# Patient Record
Sex: Male | Born: 2000
Health system: Southern US, Community
[De-identification: ages and names within clinical notes are randomized; demographics above are authoritative.]

## PROBLEM LIST (undated history)

## (undated) DIAGNOSIS — J020 Streptococcal pharyngitis: Secondary | ICD-10-CM

## (undated) DIAGNOSIS — T7840XA Allergy, unspecified, initial encounter: Secondary | ICD-10-CM

## (undated) DIAGNOSIS — J302 Other seasonal allergic rhinitis: Secondary | ICD-10-CM

## (undated) DIAGNOSIS — J181 Lobar pneumonia, unspecified organism: Principal | ICD-10-CM

## (undated) DIAGNOSIS — S83002A Unspecified subluxation of left patella, initial encounter: Principal | ICD-10-CM

## (undated) DIAGNOSIS — J351 Hypertrophy of tonsils: Secondary | ICD-10-CM

## (undated) DIAGNOSIS — H6690 Otitis media, unspecified, unspecified ear: Secondary | ICD-10-CM

## (undated) HISTORY — DX: Unspecified subluxation of left patella, initial encounter: S83.002A

## (undated) HISTORY — DX: Streptococcal pharyngitis: J02.0

## (undated) HISTORY — DX: Other seasonal allergic rhinitis: J30.2

## (undated) HISTORY — DX: Otitis media, unspecified, unspecified ear: H66.90

## (undated) HISTORY — DX: Allergy, unspecified, initial encounter: T78.40XA

## (undated) HISTORY — PX: MOUTH SURGERY: SHX715

## (undated) HISTORY — PX: CIRCUMCISION: SUR203

## (undated) HISTORY — DX: Lobar pneumonia, unspecified organism: J18.1

## (undated) HISTORY — DX: Hypertrophy of tonsils: J35.1

---
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
## 2022-01-11 ENCOUNTER — Encounter: Payer: Self-pay | Admitting: Pediatrics

## 2022-01-11 ENCOUNTER — Ambulatory Visit (INDEPENDENT_AMBULATORY_CARE_PROVIDER_SITE_OTHER): Payer: Self-pay | Admitting: Pediatrics

## 2022-01-11 VITALS — Wt 199.1 lb

## 2022-01-11 DIAGNOSIS — Z23 Encounter for immunization: Secondary | ICD-10-CM

## 2022-01-11 DIAGNOSIS — G44209 Tension-type headache, unspecified, not intractable: Secondary | ICD-10-CM | POA: Insufficient documentation

## 2022-01-11 DIAGNOSIS — B354 Tinea corporis: Secondary | ICD-10-CM

## 2022-01-11 MED ORDER — ONDANSETRON HCL 4 MG PO TABS: 4.0000 mg | ORAL_TABLET | Freq: Three times a day (TID) | ORAL | 0 refills | Status: AC | PRN

## 2022-01-11 MED ORDER — CLOTRIMAZOLE-BETAMETHASONE 1-0.05 % EX CREA: 1.0000 | TOPICAL_CREAM | Freq: Two times a day (BID) | CUTANEOUS | 0 refills | Status: AC

## 2022-01-11 NOTE — Patient Instructions (Addendum)
Migraine cocktail: Advil, Benadryl and Zofran  Zofran sent to the pharmacy Clotrimazole sent to the pharmacy for the ring worm  Body Ringworm Body ringworm is an infection of the skin that often causes a ring-shaped rash. Body ringworm is also called tinea corporis. Body ringworm can affect any part of your skin. This condition is easily spread from person to person (is very contagious). What are the causes? This condition is caused by fungi called dermatophytes. The condition develops when these fungi grow out of control on the skin. You can get this condition if you touch a person or animal that has it. You can also get it if you share any items with an infected person or pet. These include: Clothing, bedding, and towels. Brushes or combs. Gym equipment. Any other object that has the fungus on it. What increases the risk? You are more likely to develop this condition if you: Play sports that involve close physical contact, such as wrestling. Sweat a lot. Live in areas that are hot and humid. Use public showers. Have a weakened disease-fighting system (immune system). What are the signs or symptoms? Symptoms of this condition include: Itchy, raised red spots and bumps. Red scaly patches. A ring-shaped rash. The rash may have: A clear center. Scales or red bumps at its center. Redness near its borders. Dry and scaly skin on or around it. How is this diagnosed? This condition can usually be diagnosed with a skin exam. A skin scraping may be taken from the affected area and examined under a microscope to see if the fungus is present. How is this treated? This condition may be treated with: An antifungal cream or ointment. An antifungal shampoo. Antifungal medicines. These may be prescribed if your ringworm: Is severe. Keeps coming back or lasts a long time. Follow these instructions at home: Take over-the-counter and prescription medicines only as told by your health care  provider. If you were given an antifungal cream or ointment: Use it as told by your health care provider. Wash the infected area and dry it completely before applying the cream or ointment. If you were given an antifungal shampoo: Use it as told by your health care provider. Leave the shampoo on your body for 3-5 minutes before rinsing. While you have a rash: Wear loose clothing to stop clothes from rubbing and irritating it. Wash or change your bed sheets every night. Wash clothes and bed sheets in hot water. Disinfect or throw out items that may be infected. Wash your hands often with soap and water for at least 20 seconds. If soap and water are not available, use hand sanitizer. If your pet has the same infection, take your pet to see a veterinarian for treatment. How is this prevented? Take a bath or shower every day and after every time you work out or play sports. Dry your skin completely after bathing. Wear sandals or shoes in public places and showers. Wash athletic clothes after each use. Do not share personal items with others. Avoid touching red patches of skin on other people. Avoid touching pets that have bald spots. If you touch an animal that has a bald spot, wash your hands. Contact a health care provider if: Your rash continues to spread after 7 days of treatment. Your rash is not gone in 4 weeks. The area around your rash gets red, warm, tender, and swollen. This information is not intended to replace advice given to you by your health care provider. Make sure you discuss  any questions you have with your health care provider. Document Revised: 08/09/2021 Document Reviewed: 08/09/2021 Elsevier Patient Education  [place].

## 2022-01-11 NOTE — Progress Notes (Signed)
Scott Kemp is an 21 y.o. male who presents with his mother.  Presents with dry scaly rash to right upper thigh, left abdomen and right wrist for the past week. No fever, no discharge, no swelling and no limitation of motion. Reports that his girlfriend recently had ring worm and his next door neighbors keep outdoor pets. Additional complaint of headache on and off for the last two weeks. Significant family history of migraines in mother and brother. Describes headache as aching, pulsing and tension-like around the temples and back behind the ears. Has not taken any medication for headaches. Denies fevers, increased work of breathing, wheezing, vomiting, diarrhea, abdominal pain, sore throat. No cough or congestion. No known drug allergies.  Patient is overdue for well child check.  The following portions of the patient's history were reviewed and updated as appropriate: allergies, current medications, past family history, past medical history, past social history, past surgical history, and problem list. Review of Systems   Constitutional: Negative. Negative for fever, activity change and appetite change.  HENT: Negative. Negative for ear pain, congestion and rhinorrhea.  Eyes: Negative.  Respiratory: Negative. Negative for cough and wheezing.  Cardiovascular: Negative.  Gastrointestinal: Negative.  Musculoskeletal: Negative. Negative for myalgias, joint swelling and gait problem.     Objective:    Physical Exam  Constitutional: He appears well-developed and well-nourished. He is active. No distress.  HENT:  Right Ear: Tympanic membrane normal.  Left Ear: Tympanic membrane normal.  Nose: No nasal discharge.  Mouth/Throat: Mucous membranes are moist. No tonsillar exudate. Oropharynx is clear. Pharynx is normal.  Eyes: Pupils are equal, round, and reactive to light.  Neck: Normal range of motion. No adenopathy.  Cardiovascular: Regular rhythm.  No murmur heard.  Pulmonary/Chest:  Effort normal. No respiratory distress. She exhibits no retraction.  Abdominal: Soft. Bowel sounds are normal. He exhibits no distension.  Musculoskeletal: He exhibits no edema and no deformity.  Neurological: He is alert.  Skin: Skin is warm. No petechiae but has dry scaly circular patches to right wrist, right upper thigh and left abdomen with central clearing   Assessment:    Tinea corporis  Tension headache  Plan:   Clotrimazole cream as ordered for 5 weeks, even if plaques are completely gone.  Nail hygiene discussed Cover area with gauze and bandage until plaques clear Migraine cocktail for headache as directed-- Zofran, Advil and Benadryl Return precautions provided Follow-up as needed for symptoms that worsen/fail to improve  Flu vaccine per orders. Indications, contraindications and side effects of vaccine/vaccines discussed with parent and parent verbally expressed understanding and also agreed with the administration of vaccine/vaccines as ordered above today.Handout (VIS) given for each vaccine at this visit. Orders Placed This Encounter  Procedures   Flu Vaccine QUAD 6+ mos PF IM (Fluarix Quad PF)     Meds ordered this encounter  Medications   ondansetron (ZOFRAN) 4 MG tablet    Sig: Take 1 tablet (4 mg total) by mouth every 8 (eight) hours as needed for up to 5 days for nausea or vomiting.    Dispense:  15 tablet    Refill:  0    Order Specific Question:   Supervising Provider    Answer:   Georgiann Hahn [[id number]]   clotrimazole-betamethasone (LOTRISONE) cream    Sig: Apply 1 Application topically 2 (two) times daily.    Dispense:  60 g    Refill:  0    Order Specific Question:   Supervising Provider    Answer:  RAMGOOLAM, ANDRES [[id number]]

## 2023-05-06 ENCOUNTER — Telehealth (INDEPENDENT_AMBULATORY_CARE_PROVIDER_SITE_OTHER): Payer: Self-pay | Admitting: Neurology

## 2023-05-06 NOTE — Telephone Encounter (Signed)
 Returned call to Arpan about needing a resolution letter for the tics he has back in 2020. I let him know Ill send this message over to Dr. Merri Brunette to see what he wants to do moving forward.  Arvon understood message

## 2023-05-06 NOTE — Telephone Encounter (Signed)
  Name of who is calling: Neita Goodnight Relationship to Patient: self   Best contact number: [phone]  Provider they see: Nab  Reason for call: He is no longer a pt here, but was last seen in 2020 for tics. Pt stated tics were due to his hair & further treatment was not needed. He is joining [employer] & needs a resolution letter for this issue.     PRESCRIPTION REFILL ONLY  Name of prescription:  Pharmacy:

## 2023-05-06 NOTE — Telephone Encounter (Signed)
 Spoke with patients father. Informed him that if his son needed medical records then the patient would need to request them himself

## 2023-05-07 NOTE — Telephone Encounter (Signed)
 Called Muhamad to let him know since it has been so long and with his age he would have to be seen by and adult neurologist to resolve the issue and get resolution letter.  I also let him know that if he needs any office notes sent to him he is more than welcome to call us back and get them.  Linard understood message.

## 2023-05-19 ENCOUNTER — Ambulatory Visit: Payer: Self-pay | Admitting: Pediatrics
